# Patient Record
Sex: Male | Born: 2000 | Race: White | Hispanic: No | Marital: Single | Smoking: Current every day smoker
Health system: Southern US, Community
[De-identification: ages and names within clinical notes are randomized; demographics above are authoritative.]

## PROBLEM LIST (undated history)

## (undated) HISTORY — PX: STAPEDES SURGERY: SHX789

---
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
## 2018-10-29 ENCOUNTER — Ambulatory Visit (HOSPITAL_COMMUNITY)
Admission: EM | Admit: 2018-10-29 | Discharge: 2018-10-29 | Disposition: A | Discharge status: Home / Self Care | Payer: Self-pay | Attending: Family Medicine | Admitting: Family Medicine

## 2018-10-29 ENCOUNTER — Encounter (HOSPITAL_COMMUNITY): Payer: Self-pay | Admitting: Emergency Medicine

## 2018-10-29 ENCOUNTER — Other Ambulatory Visit: Payer: Self-pay

## 2018-10-29 DIAGNOSIS — R1031 Right lower quadrant pain: Secondary | ICD-10-CM | POA: Insufficient documentation

## 2018-10-29 DIAGNOSIS — R112 Nausea with vomiting, unspecified: Secondary | ICD-10-CM | POA: Insufficient documentation

## 2018-10-29 MED ORDER — ONDANSETRON 4 MG PO TBDP: 4.0000 mg | ORAL_TABLET | Freq: Three times a day (TID) | ORAL | 0 refills | Status: DC | PRN

## 2018-10-29 NOTE — ED Provider Notes (Signed)
[facility]    CSN: [id number] Arrival date & time: 10/29/18  1906     History   Chief Complaint Chief Complaint  Patient presents with  . Emesis    HPI Dennis Freeman is a 18 y.o. male no significant past medical history presenting today for evaluation of vomiting.  Patient states that beginning today he has had few episodes of vomiting.  He notes that he has had blood present in this.  Notes that it was streaked and has had occasional chunks.  Denies pure bloody emesis.  States that he had a similar episode in September/October where he would have similar reported bloody vomiting.  He has had associated right-sided lower abdominal pain with this.  He has had one episode of diarrhea, but no persistent diarrhea.  Has had decreased oral intake of recently.  States abdominal pain is a crampy sensation, but becomes sharp with vomiting.  Denies fevers.  Denies associated URI symptoms.  Denies urinary symptoms.  Denies chest pain or shortness of breath.  HPI  History reviewed. No pertinent past medical history.  There are no active problems to display for this patient.   Past Surgical History:  Procedure Laterality Date  . STAPEDES SURGERY         Home Medications    Prior to Admission medications   Medication Sig Start Date End Date Taking? Authorizing Provider  ondansetron (ZOFRAN ODT) 4 MG disintegrating tablet Take 1 tablet (4 mg total) by mouth every 8 (eight) hours as needed for nausea or vomiting. 10/29/18   , Junius CreamerHallie C, PA-C    Family History History reviewed. No pertinent family history.  Social History Social History   Tobacco Use  . Smoking status: Current Every Day Smoker    Types: E-cigarettes, Cigarettes  Substance Use Topics  . Alcohol use: Yes    Alcohol/week: 0.0 standard drinks  . Drug use: Never     Allergies   Patient has no known allergies.   Review of Systems Review of Systems  Constitutional: Negative for activity  change, appetite change, chills, fatigue and fever.  HENT: Negative for congestion, ear pain, rhinorrhea, sinus pressure, sore throat and trouble swallowing.   Eyes: Negative for discharge and redness.  Respiratory: Negative for cough, chest tightness and shortness of breath.   Cardiovascular: Negative for chest pain.  Gastrointestinal: Positive for abdominal pain, diarrhea, nausea and vomiting.  Genitourinary: Negative for dysuria and frequency.  Musculoskeletal: Negative for myalgias.  Skin: Negative for rash.  Neurological: Negative for dizziness, light-headedness and headaches.     Physical Exam Triage Vital Signs ED Triage Vitals  Enc Vitals Group     BP 10/29/18 2006 134/77     Pulse Rate 10/29/18 2006 85     Resp 10/29/18 2006 16     Temp 10/29/18 2006 99 F (37.2 C)     Temp Source 10/29/18 2006 Oral     SpO2 10/29/18 2006 100 %     Weight --      Height --      Head Circumference --      Peak Flow --      Pain Score 10/29/18 2007 7     Pain Loc --      Pain Edu? --      Excl. in GC? --    No data found.  Updated Vital Signs BP 134/77 (BP Location: Left Arm)   Pulse 85   Temp 99 F (37.2 C) (Oral)  Resp 16   SpO2 100%   Visual Acuity Right Eye Distance:   Left Eye Distance:   Bilateral Distance:    Right Eye Near:   Left Eye Near:    Bilateral Near:     Physical Exam Vitals signs and nursing note reviewed.  Constitutional:      Appearance: He is well-developed.  HENT:     Head: Normocephalic and atraumatic.     Mouth/Throat:     Comments: Oral mucosa pink and moist, no tonsillar enlargement or exudate. Posterior pharynx patent and nonerythematous, no uvula deviation or swelling. Normal phonation. Eyes:     Conjunctiva/sclera: Conjunctivae normal.  Neck:     Musculoskeletal: Neck supple.  Cardiovascular:     Rate and Rhythm: Normal rate and regular rhythm.     Heart sounds: No murmur.  Pulmonary:     Effort: Pulmonary effort is normal. No  respiratory distress.     Breath sounds: Normal breath sounds.     Comments: Breathing comfortably at rest, CTABL, no wheezing, rales or other adventitious sounds auscultated Abdominal:     Palpations: Abdomen is soft.     Tenderness: There is abdominal tenderness.     Comments: Abdomen soft, nondistended, tenderness to palpation in right lower quadrant, negative rebound, negative Rovsing, negative obturator and psoas, negative Murphy's  Skin:    General: Skin is warm and dry.  Neurological:     Mental Status: He is alert.      UC Treatments / Results  Labs (all labs ordered are listed, but only abnormal results are displayed) Labs Reviewed - No data to display  EKG None  Radiology No results found.  Procedures Procedures (including critical care time)  Medications Ordered in UC Medications - No data to display  Initial Impression / Assessment and Plan / UC Course  I have reviewed the triage vital signs and the nursing notes.  Pertinent labs & imaging results that were available during my care of the patient were reviewed by me and considered in my medical decision making (see chart for details).     1 day of vomiting with right lower quadrant pain, vital signs stable, negative rebound/negative peritoneal signs.  Previous similar episode.  Discussed with patient given constellation of symptoms if abdominal pain worsening he should go to the emergency room for further evaluation of possible appendicitis.  Given he has had a similar episode of this in his past, will treat this symptomatically with Zofran, oral hydration, monitoring of pain.  Advised to have a low threshold in the next 24 to 48 hours for further evaluation.  Blood in the vomit seems more streaking versus straight blood.  Advised if this worsens or persist also go to the emergency room for further evaluation.  If he has recurrent episodes of similar symptoms follow-up with gastroenterology for further  evaluation.Discussed strict return precautions. Patient verbalized understanding and is agreeable with plan.  Final Clinical Impressions(s) / UC Diagnoses   Final diagnoses:  Non-intractable vomiting with nausea, unspecified vomiting type  Right lower quadrant abdominal pain     Discharge Instructions     Please begin using Zofran every 8 hours as needed for nausea and vomiting Please focus on oral rehydration, may get Pedialyte or Gatorade in order to prevent dehydration Slowly transition to blander diet including eating rice, toast, crackers, soups, potatoes  Over the next 24 to 48 hours if you have worsening abdominal pain or have persistent blood in the vomit please go to the  emergency room for further evaluation of this.  Please have a low threshold to go.   Follow-up if symptoms not resolving or worsening  If this becomes a recurrent issue I recommend following up with gastroenterology   ED Prescriptions    Medication Sig Dispense Auth. Provider   ondansetron (ZOFRAN ODT) 4 MG disintegrating tablet Take 1 tablet (4 mg total) by mouth every 8 (eight) hours as needed for nausea or vomiting. 20 tablet , [place] C, PA-C     Controlled Substance Prescriptions [place] Controlled Substance Registry consulted? Not Applicable   Lew Dawes,  C, [place]-C 10/29/18 2049

## 2018-10-29 NOTE — Discharge Instructions (Signed)
Please begin using Zofran every 8 hours as needed for nausea and vomiting Please focus on oral rehydration, may get Pedialyte or Gatorade in order to prevent dehydration Slowly transition to blander diet including eating rice, toast, crackers, soups, potatoes  Over the next 24 to 48 hours if you have worsening abdominal pain or have persistent blood in the vomit please go to the emergency room for further evaluation of this.  Please have a low threshold to go.   Follow-up if symptoms not resolving or worsening  If this becomes a recurrent issue I recommend following up with gastroenterology

## 2018-10-29 NOTE — ED Triage Notes (Signed)
Complains of vomiting blood since September, intermittent.  Current episode of vomiting started this morning.  Patient has vomited x 2 today and reports blood in emesis.  Reports right side abdominal pain when vomiting occurs.  Reports diarrhea x 1 week.  More frequent episodes recently.

## 2018-12-17 DIAGNOSIS — Z6824 Body mass index (BMI) 24.0-24.9, adult: Secondary | ICD-10-CM | POA: Diagnosis not present

## 2018-12-17 DIAGNOSIS — M25511 Pain in right shoulder: Secondary | ICD-10-CM | POA: Diagnosis not present

## 2021-12-01 ENCOUNTER — Ambulatory Visit (INDEPENDENT_AMBULATORY_CARE_PROVIDER_SITE_OTHER): Payer: Self-pay | Admitting: Gastroenterology

## 2021-12-01 ENCOUNTER — Encounter: Payer: Self-pay | Admitting: Gastroenterology

## 2021-12-01 VITALS — BP 124/78 | HR 78 | Ht 71.0 in | Wt 189.0 lb

## 2021-12-01 DIAGNOSIS — K920 Hematemesis: Secondary | ICD-10-CM

## 2021-12-01 NOTE — Patient Instructions (Signed)
If you are age 23 or older, your body mass index should be between 23-30. Your Body mass index is 26.36 kg/m?Marland Kitchen If this is out of the aforementioned range listed, please consider follow up with your Primary Care Provider. ? ?If you are age 50 or younger, your body mass index should be between 19-25. Your Body mass index is 26.36 kg/m?Marland Kitchen If this is out of the aformentioned range listed, please consider follow up with your Primary Care Provider.  ? ?You have been scheduled for an endoscopy. Please follow written instructions given to you at your visit today. ?If you use inhalers (even only as needed), please bring them with you on the day of your procedure. ? ? ?The [place] [facility] providers would like to encourage you to use [facility] to communicate with providers for non-urgent requests or questions.  Due to long hold times on the telephone, sending your provider a message by [facility] may be a faster and more efficient way to get a response.  Please allow 48 business hours for a response.  Please remember that this is for non-urgent requests.  ? ?It was a pleasure to see you today! ? ?Thank you for trusting me with your gastrointestinal care!   ? ?Scott E.Tomasa Rand, MD  ? ?

## 2021-12-01 NOTE — Progress Notes (Signed)
? ?HPI : Dennis Freeman is a very pleasant 21 year old male with no significant medical history who presents to [place] for further evaluation of recurrent hematemesis.  The patient states that he has been having episodic vomiting sometimes with bloody emesis for several years now.  For started about 4 years ago and was infrequent, maybe once a month.  In recent months has been more frequent (about once a week), but it has been happening daily now for the past week.  He describes these episodes as sudden onset nausea followed by retching and heaving.  Usually these initial episodes are nonproductive, but then he spits up blood, then sometimes will have bloody emesis following that.  No episodes of frank large-volume hematemesis.  No coffee-ground emesis.  He has pictures on his phone that he shows me which show small amounts of bright red or dark red blood, likely with clots.  No food present. ?He denies any other symptoms other than the sudden onset nausea and emesis.  Specifically, he denies abdominal pain, heartburn, acid regurgitation, dysphagia.  The symptoms used to occur at any time, but have now been almost strictly in the morning.  He thought that the episodes might be related to alcohol, but his most recent episodes he has not had any alcohol in the prior day.  His bowel movements are normal, with formed brown stools, no melena or hematochezia.  He denies constipation or diarrhea.  His appetite is good and his weight is stable.  He never vomits after meals ?He denies any problems with excessive stress or anxiety. ?He rarely takes NSAIDs.  He has been vaping since 2017.  He denies marijuana or any other illicit drug use.  No other over-the-counter medications or supplements. ? ? ? ?No past medical history on file. ? ? ?Past Surgical History:  ?Procedure Laterality Date  ? STAPEDES SURGERY    ? ?Family History  ?Problem Relation Age of Onset  ? Heart Problems Paternal Grandmother   ? Cancer Paternal Grandfather    ? Colon cancer Neg Hx   ? Colon polyps Neg Hx   ? Esophageal cancer Neg Hx   ? Stomach cancer Neg Hx   ? Pancreatic cancer Neg Hx   ? ?Social History  ? ?Tobacco Use  ? Smoking status: Every Day  ?  Types: E-cigarettes, Cigarettes  ? Smokeless tobacco: Former  ?Vaping Use  ? Vaping Use: Every day  ?Substance Use Topics  ? Alcohol use: Yes  ?  Comment: occ  ? Drug use: Never  ? ?No current outpatient medications on file.  ? ?No current facility-administered medications for this visit.  ? ?No Known Allergies ? ? ?Review of Systems: ?All systems reviewed and negative except where noted in HPI.  ? ? ?No results found. ? ?Physical Exam: ?BP 124/78   Pulse 78   Ht 5\' 11"  (1.803 m)   Wt 189 lb (85.7 kg)   SpO2 96%   BMI 26.36 kg/m?  ?Constitutional: Pleasant,well-developed, Caucasian male in no acute distress.  Accompanied by mother ?HEENT: Normocephalic and atraumatic. Conjunctivae are normal. No scleral icterus.  Mallampati 2 ?Cardiovascular: Normal rate, regular rhythm.  ?Pulmonary/chest: Effort normal and breath sounds normal. No wheezing, rales or rhonchi. ?Abdominal: Soft, nondistended, nontender. Bowel sounds active throughout. There are no masses palpable. No hepatomegaly. ?Extremities: no edema ?Neurological: Alert and oriented to person place and time. ?Skin: Skin is warm and dry. No rashes noted. ?Psychiatric: Normal mood and affect. Behavior is normal. ? ?CBC ?  No results found for: WBC, RBC, HGB, HCT, PLT, MCV, MCH, MCHC, RDW, LYMPHSABS, MONOABS, EOSABS, BASOSABS ? ?CMP  ?No results found for: NA, K, CL, CO2, GLUCOSE, BUN, CREATININE, CALCIUM, PROT, ALBUMIN, AST, ALT, ALKPHOS, BILITOT, GFRNONAA, GFRAA ? ? ?ASSESSMENT AND PLAN: ?21 year old male with 4-year history of intermittent nausea and vomiting, sometimes with bright red blood, symptoms becoming more frequent and more bloody.  No other GI symptoms such as abdominal pain, GERD symptoms or dysphagia.  No concerning symptoms such as unintentional weight  loss or decreased appetite.  He denies problems with stress or anxiety.  He denies taking medications or using drugs that might be contributing to his symptoms.  Overall, this presentation is more consistent with a anxiety related nausea/vomiting with hematemesis likely secondary to esophagitis versus Mallory-Weiss tear.  Very low concern for a clinically significant source of upper GI bleed such as bleeding peptic ulcer.  He needs an upper endoscopy to further evaluate.  We will plan for this procedure next week and make further recommendations based on findings. ? ?Hematemesis ?- EGD next week ?- Further recommendations to follow ? ?The details, risks (including bleeding, perforation, infection, missed lesions, medication reactions and possible hospitalization or surgery if complications occur), benefits, and alternatives to EGD with possible biopsy and possible dilation were discussed with the patient and he consents to proceed.  ? ?[place] E. Tomasa Rand, MD ?[facility] ? ?No ref. provider found ? ?

## 2021-12-05 ENCOUNTER — Encounter: Payer: Self-pay | Admitting: Gastroenterology

## 2021-12-05 ENCOUNTER — Other Ambulatory Visit: Payer: Self-pay

## 2021-12-05 ENCOUNTER — Ambulatory Visit (AMBULATORY_SURGERY_CENTER): Payer: Self-pay | Admitting: Gastroenterology

## 2021-12-05 ENCOUNTER — Telehealth: Payer: Self-pay | Admitting: *Deleted

## 2021-12-05 VITALS — BP 110/65 | HR 82 | Temp 98.4°F | Resp 14 | Ht 71.0 in | Wt 189.0 lb

## 2021-12-05 DIAGNOSIS — K449 Diaphragmatic hernia without obstruction or gangrene: Secondary | ICD-10-CM

## 2021-12-05 DIAGNOSIS — K209 Esophagitis, unspecified without bleeding: Secondary | ICD-10-CM

## 2021-12-05 DIAGNOSIS — K920 Hematemesis: Secondary | ICD-10-CM

## 2021-12-05 MED ORDER — SODIUM CHLORIDE 0.9 % IV SOLN: 500.0000 mL | Freq: Once | INTRAVENOUS | Status: DC

## 2021-12-05 MED ORDER — OMEPRAZOLE 20 MG PO CPDR: 20.0000 mg | DELAYED_RELEASE_CAPSULE | Freq: Two times a day (BID) | ORAL | 2 refills | Status: AC

## 2021-12-05 NOTE — Progress Notes (Signed)
Report to PACU, RN, vss, BBS= Clear.  

## 2021-12-05 NOTE — Patient Instructions (Signed)
Handout on Esophagitis and hiatal hernia provided  ? ?Await pathology results.  ? ?Start Omeprazole (Prilosec) 20 mg by mouth two times a day for 8 weeks. ? ?Repeat Endoscopy on 01/20/22 at 10:00 am arrival time of 9:00 am  ? ?YOU HAD AN ENDOSCOPIC PROCEDURE TODAY AT THE [facility]:   Refer to the procedure report that was given to you for any specific questions about what was found during the examination.  If the procedure report does not answer your questions, please call your gastroenterologist to clarify.  If you requested that your care partner not be given the details of your procedure findings, then the procedure report has been included in a sealed envelope for you to review at your convenience later. ? ?YOU SHOULD EXPECT: Some feelings of bloating in the abdomen. Passage of more gas than usual.  Walking can help get rid of the air that was put into your GI tract during the procedure and reduce the bloating. If you had a lower endoscopy (such as a colonoscopy or flexible sigmoidoscopy) you may notice spotting of blood in your stool or on the toilet paper. If you underwent a bowel prep for your procedure, you may not have a normal bowel movement for a few days. ? ?Please Note:  You might notice some irritation and congestion in your nose or some drainage.  This is from the oxygen used during your procedure.  There is no need for concern and it should clear up in a day or so. ? ?SYMPTOMS TO REPORT IMMEDIATELY: ? ?Following upper endoscopy (EGD) ? Vomiting of blood or coffee ground material ? New chest pain or pain under the shoulder blades ? Painful or persistently difficult swallowing ? New shortness of breath ? Fever of 100?F or higher ? Black, tarry-looking stools ? ?For urgent or emergent issues, a gastroenterologist can be reached at any hour by calling [phone]. ?Do not use MyChart messaging for urgent concerns.  ? ? ?DIET:  We do recommend a small meal at first, but then you may  proceed to your regular diet.  Drink plenty of fluids but you should avoid alcoholic beverages for 24 hours. ? ?ACTIVITY:  You should plan to take it easy for the rest of today and you should NOT DRIVE or use heavy machinery until tomorrow (because of the sedation medicines used during the test).   ? ?FOLLOW UP: ?Our staff will call the number listed on your records 48-72 hours following your procedure to check on you and address any questions or concerns that you may have regarding the information given to you following your procedure. If we do not reach you, we will leave a message.  We will attempt to reach you two times.  During this call, we will ask if you have developed any symptoms of COVID 19. If you develop any symptoms (ie: fever, flu-like symptoms, shortness of breath, cough etc.) before then, please call [phone].  If you test positive for Covid 19 in the 2 weeks post procedure, please call and report this information to [place].   ? ?If any biopsies were taken you will be contacted by phone or by letter within the next 1-3 weeks.  Please call us at [phone] if you have not heard about the biopsies in 3 weeks.  ? ? ?SIGNATURES/CONFIDENTIALITY: ?You and/or your care partner have signed paperwork which will be entered into your electronic medical record.  These signatures attest to the fact that that the information  above on your After Visit Summary has been reviewed and is understood.  Full responsibility of the confidentiality of this discharge information lies with you and/or your care-partner. ? ? ?

## 2021-12-05 NOTE — Telephone Encounter (Signed)
EGD instructions reviewed with patient and mother. ?

## 2021-12-05 NOTE — Progress Notes (Signed)
Called to room to assist during endoscopic procedure.  Patient ID and intended procedure confirmed with present staff. Received instructions for my participation in the procedure from the performing physician.  

## 2021-12-05 NOTE — Op Note (Signed)
[facility] ?Patient Name: Dennis Freeman ?Procedure Date: 12/05/2021 8:13 AM ?MRN: [id number] ?Endoscopist: Odilia Damico E. Tomasa Randunningham , MD ?Age: 21 ?Referring MD:  ?Date of Birth: 10-27-2000 ?Gender: Male ?Account #: [id number] ?Procedure:                Upper GI endoscopy ?Indications:              Hematemesis ?Medicines:                Monitored Anesthesia Care ?Procedure:                Pre-Anesthesia Assessment: ?                          - Prior to the procedure, a History and Physical  ?                          was performed, and patient medications and  ?                          allergies were reviewed. The patient's tolerance of  ?                          previous anesthesia was also reviewed. The risks  ?                          and benefits of the procedure and the sedation  ?                          options and risks were discussed with the patient.  ?                          All questions were answered, and informed consent  ?                          was obtained. Prior Anticoagulants: The patient has  ?                          taken no previous anticoagulant or antiplatelet  ?                          agents. ASA Grade Assessment: II - A patient with  ?                          mild systemic disease. After reviewing the risks  ?                          and benefits, the patient was deemed in  ?                          satisfactory condition to undergo the procedure. ?                          After obtaining informed consent, the endoscope was  ?  passed under direct vision. Throughout the  ?                          procedure, the patient's blood pressure, pulse, and  ?                          oxygen saturations were monitored continuously. The  ?                          Endoscope was introduced through the mouth, and  ?                          advanced to the second part of duodenum. The upper  ?                          GI endoscopy was accomplished without  difficulty.  ?                          The patient tolerated the procedure well. ?Scope In: ?Scope Out: ?Findings:                 The examined portions of the nasopharynx,  ?                          oropharynx and larynx were normal. ?                          Moderately severe esophagitis with no bleeding was  ?                          found. ?                          The exam of the esophagus was otherwise normal. ?                          A 2 cm hiatal hernia was present. ?                          Patchy moderately erythematous mucosa without  ?                          bleeding was found in the gastric antrum. Biopsies  ?                          were taken with a cold forceps for Helicobacter  ?                          pylori testing. Estimated blood loss was minimal. ?                          The exam of the stomach was otherwise normal. ?                          The examined duodenum was normal. ?Complications:  No immediate complications. ?Estimated Blood Loss:     Estimated blood loss was minimal. ?Impression:               - The examined portions of the nasopharynx,  ?                          oropharynx and larynx were normal. ?                          - Moderately severe erosive esophagitis with no  ?                          bleeding. Unclear if this is secondary to GERD vs  ?                          recurrent emesis ?                          - 2 cm hiatal hernia. ?                          - Erythematous mucosa in the antrum. Biopsied. ?                          - Normal examined duodenum. ?Recommendation:           - Patient has a contact number available for  ?                          emergencies. The signs and symptoms of potential  ?                          delayed complications were discussed with the  ?                          patient. Return to normal activities tomorrow.  ?                          Written discharge instructions were provided to the  ?                           patient. ?                          - Resume previous diet. ?                          - Continue present medications. ?                          - Await pathology results. ?                          - Repeat upper endoscopy in 6-8 weeks to check  ?                          healing. ?                          -  Use Prilosec (omeprazole) 20 mg PO BID for 8  ?                          weeks. ?Braelee Herrle E. Tomasa Rand, MD ?12/05/2021 8:40:17 AM ?This report has been signed electronically. ?

## 2021-12-05 NOTE — Progress Notes (Signed)
History and Physical Interval Note: ? ?12/05/2021 ?8:19 AM ? ?Dennis Freeman  has presented today for endoscopic procedure(s), with the diagnosis of  ?Encounter Diagnosis  ?Name Primary?  ? Hematemesis, unspecified whether nausea present Yes  ?Marland Kitchen  The various methods of evaluation and treatment have been discussed with the patient and/or family. After consideration of risks, benefits and other options for treatment, the patient has consented to  the endoscopic procedure(s). ? ? The patient's history has been reviewed, patient examined, no change in status, stable for endoscopic procedure(s).  I have reviewed the patient's chart and labs.  Questions were answered to the patient's satisfaction.   ? ? ?Gurkaran Rahm E. Tomasa Rand, MD ?[facility] ? ?

## 2021-12-07 ENCOUNTER — Telehealth: Payer: Self-pay | Admitting: *Deleted

## 2021-12-07 ENCOUNTER — Encounter: Payer: Self-pay | Admitting: Gastroenterology

## 2021-12-07 NOTE — Telephone Encounter (Signed)
First follow up call attempt.  Voicemail box full, unable to leave a message. 

## 2022-01-20 ENCOUNTER — Telehealth: Payer: Self-pay | Admitting: Gastroenterology

## 2022-01-20 ENCOUNTER — Encounter: Payer: Self-pay | Admitting: Gastroenterology

## 2022-01-20 NOTE — Telephone Encounter (Signed)
Good Morning Dr. Tomasa Rand ? ?I called patient at 9:15 am today he stated he called and cancelled appointment a few days ago.  He stated he didn't think he needed to have done.  ? ?

## 2022-02-16 ENCOUNTER — Encounter: Payer: Self-pay | Admitting: Gastroenterology
